# Patient Record
Sex: Male | Born: 1955 | Hispanic: Yes | Marital: Married | Smoking: Former smoker
Health system: Southern US, Community
[De-identification: ages and names within clinical notes are randomized; demographics above are authoritative.]

## PROBLEM LIST (undated history)

## (undated) DIAGNOSIS — I10 Essential (primary) hypertension: Secondary | ICD-10-CM

## (undated) DIAGNOSIS — D571 Sickle-cell disease without crisis: Secondary | ICD-10-CM

## (undated) DIAGNOSIS — N289 Disorder of kidney and ureter, unspecified: Secondary | ICD-10-CM

## (undated) HISTORY — PX: NO PAST SURGERIES: SHX2092

---

## 2014-09-21 ENCOUNTER — Ambulatory Visit (INDEPENDENT_AMBULATORY_CARE_PROVIDER_SITE_OTHER): Payer: BLUE CROSS/BLUE SHIELD | Admitting: Family Medicine

## 2014-09-21 ENCOUNTER — Encounter: Payer: Self-pay | Admitting: Family Medicine

## 2014-09-21 VITALS — BP 164/98 | HR 76 | Ht 65.0 in | Wt 181.6 lb

## 2014-09-21 DIAGNOSIS — I10 Essential (primary) hypertension: Secondary | ICD-10-CM | POA: Diagnosis not present

## 2014-09-21 DIAGNOSIS — E669 Obesity, unspecified: Secondary | ICD-10-CM

## 2014-09-21 DIAGNOSIS — K4090 Unilateral inguinal hernia, without obstruction or gangrene, not specified as recurrent: Secondary | ICD-10-CM | POA: Diagnosis not present

## 2014-09-21 DIAGNOSIS — E66811 Obesity, class 1: Secondary | ICD-10-CM | POA: Insufficient documentation

## 2014-09-21 HISTORY — PX: OTHER SURGICAL HISTORY: SHX169

## 2014-09-21 MED ORDER — LISINOPRIL-HYDROCHLOROTHIAZIDE 10-12.5 MG PO TABS: 1.0000 | ORAL_TABLET | Freq: Every day | ORAL | Status: DC

## 2014-09-21 NOTE — Progress Notes (Signed)
Date:  09/21/2014   Name:  Adele SchilderFaustino Linares   DOB:  [birth year]   MRN:  [id number]  PCP:  Schuyler AmorWilliam Kermit Arnette, MD    Chief Complaint: Inguinal Hernia   History of Present Illness:  This is a 59 y.o. male c/o 6 month hx R lower abdominal pain radiating to R anterior thigh. Seen with Engineer, structuralpanish translator. Thinks may be due to soccer injury two years ago but hit in [place] then. Pain worse with heavy lifting or prolonged walking, works loading trucks. No similar pain in past, no GI/GU sxs, no hx HTN.  Review of Systems:  Review of Systems  Constitutional: Negative for fever and appetite change.  Respiratory: Negative for cough and shortness of breath.   Cardiovascular: Negative for chest pain and leg swelling.  Gastrointestinal: Negative for nausea, vomiting, diarrhea, constipation and abdominal distention.  Genitourinary: Negative for dysuria and difficulty urinating.  Musculoskeletal: Negative for back pain.  Skin: Negative for rash.  Neurological: Negative for syncope.  Psychiatric/Behavioral: Negative for confusion.    There are no active problems to display for this patient.   Prior to Admission medications   Medication Sig Start Date End Date Taking? Authorizing Provider  lisinopril-hydrochlorothiazide (PRINZIDE) 10-12.5 MG per tablet Take 1 tablet by mouth daily. 09/21/14   Schuyler AmorWilliam Yaron Grasse, MD    No Known Allergies  Past Surgical History  Procedure Laterality Date  . None  09/21/2014    History  Substance Use Topics  . Smoking status: Never Smoker   . Smokeless tobacco: Not on file  . Alcohol Use: No    No family history on file.  Medication list has been reviewed and updated.  Physical Examination: BP 164/98 mmHg  Pulse 76  Ht 5\' 5"  (1.651 m)  Wt 181 lb 9.6 oz (82.373 kg)  BMI 30.22 kg/m2  Physical Exam  Constitutional: He is oriented to person, place, and time. He appears well-developed and well-nourished.  HENT:  Head: Normocephalic and atraumatic.  Mouth/Throat:  Oropharynx is clear and moist.  Eyes: Conjunctivae and EOM are normal. Pupils are equal, round, and reactive to light. No scleral icterus.  Neck: No thyromegaly present.  Cardiovascular: Normal rate, regular rhythm and normal heart sounds.   Pulmonary/Chest: Effort normal and breath sounds normal.  Abdominal: Soft. He exhibits no distension and no mass. There is no tenderness.  Genitourinary:  Large R indirect inguinal hernia reducible but bulges with cough, testicles unremarkable  Musculoskeletal: He exhibits no edema.  Lymphadenopathy:    He has no cervical adenopathy.  Neurological: He is alert and oriented to person, place, and time. Coordination normal.  Skin: Skin is warm and dry.  Psychiatric: He has a normal mood and affect. His behavior is normal.    Assessment and Plan:  1. Right inguinal hernia No lifting over 50#, refer for surgical repair (has appt next week per translator) - Ambulatory referral to General Surgery  2. Essential hypertension Begin lisinopril/HCTZ  3. Obesity (BMI 30.0-34.9) Consider blood work next visit  Return in about 4 weeks (around 10/19/2014).  Dionne AnoWilliam M. Kingsley SpittlePlonk, Jr. MD [facility]  09/21/2014

## 2014-09-21 NOTE — Patient Instructions (Signed)
Patient has large right inguinal hernia and has been referred to a Development worker, international aidgeneral surgeon for repair. He should not lift over 50 pounds until the repair is completed but should be able to return to unrestricted work postoperatively.

## 2014-09-26 ENCOUNTER — Telehealth: Payer: Self-pay

## 2014-09-26 NOTE — Telephone Encounter (Signed)
Called patient and he did not answer. I was not able to leave a voicemail since it has not been set-up.

## 2014-09-28 ENCOUNTER — Encounter: Payer: Self-pay | Admitting: Surgery

## 2014-09-28 ENCOUNTER — Ambulatory Visit (INDEPENDENT_AMBULATORY_CARE_PROVIDER_SITE_OTHER): Payer: BLUE CROSS/BLUE SHIELD | Admitting: Surgery

## 2014-09-28 VITALS — BP 161/89 | HR 64 | Temp 97.3°F | Ht 66.0 in | Wt 176.0 lb

## 2014-09-28 DIAGNOSIS — E669 Obesity, unspecified: Secondary | ICD-10-CM

## 2014-09-28 DIAGNOSIS — K4090 Unilateral inguinal hernia, without obstruction or gangrene, not specified as recurrent: Secondary | ICD-10-CM | POA: Diagnosis not present

## 2014-09-28 DIAGNOSIS — I10 Essential (primary) hypertension: Secondary | ICD-10-CM | POA: Diagnosis not present

## 2014-09-28 NOTE — Patient Instructions (Signed)
Usted esta solicitando que se repare su hernia.  Recruitment consultant en [facility].  Planearemos hacerlo la semana del 9 de agosto de 2016.  Nuestra coordinadora de cirujia va a Surveyor, mining todo y lo Best boy.  Si usted tiene alguna pregunta o preocupacion por favor llame a nuestra oficina y Educational psychologist con [place].  Usted necesitara tener una cita antes de la cirujia para prepararlo para la cirujia.  Lo llamaremos para darle la fecha y la hora de la cita.  Cuando usted llegue para la cita, necesitara ir a [employer].  Esto se [place] en [address], [address] (en el segundo piso).  La cita podria durar una a [employer].  Usted necesitara llamar [phone] entre la 1 y las 3 de la tarde el dia antes de su cirujia programada.  No puede comer ni tomar nada despues de la medianoche antes de la cirujia.  Al llegar para su cirujia, vaya al [employer] [facility] y registre en el escritorio que dice Same Day Surgery.

## 2014-09-29 ENCOUNTER — Encounter: Payer: Self-pay | Admitting: Surgery

## 2014-10-01 DIAGNOSIS — K469 Unspecified abdominal hernia without obstruction or gangrene: Secondary | ICD-10-CM | POA: Insufficient documentation

## 2014-10-01 NOTE — Progress Notes (Signed)
Patient ID: Mark Mcmillan, male   DOB: 08-Mar-1956, 59 y.o.   MRN: [id number]  Chief Complaint  Patient presents with  . Inguinal Hernia    Right- x 2 years    HPI  Mark Mcmillan is a 59 y.o. male.  referred with a 2 year history of slowly enlarging right groin hernia.  No prior repairs.  He is interested in repair options.  Seen in office with [facility].  There has been no history of nausea, vomiting or incarceration of the hernia.   Past medical history: Hypertension   Past Surgical History  Procedure Laterality Date  . None  09/21/2014  . No past surgeries      Family History  Problem Relation Age of Onset  . Diabetes Father     Social History History  Substance Use Topics  . Smoking status: Former Smoker    Quit date: 09/27/1993  . Smokeless tobacco: Never Used  . Alcohol Use: No    No Known Allergies  Current Outpatient Prescriptions  Medication Sig Dispense Refill  . lisinopril-hydrochlorothiazide (PRINZIDE) 10-12.5 MG per tablet Take 1 tablet by mouth daily. 30 tablet 2  . lisinopril-hydrochlorothiazide (PRINZIDE,ZESTORETIC) 10-12.5 MG per tablet Take 1 tablet by mouth daily.     No current facility-administered medications for this visit.    Blood pressure 161/89, pulse 64, temperature 97.3 F (36.3 C), temperature source Oral, height  (1.676 m), weight 176 lb (79.833 kg).  Review of Systems  Constitutional: Negative.   HENT: Negative.   Eyes: Negative.   Respiratory: Negative for cough.   Cardiovascular: Negative for chest pain and palpitations.  Gastrointestinal: Negative for heartburn, nausea, vomiting, abdominal pain and diarrhea.       Right groin mass c/w hernia.  Skin: Negative.   Neurological: Negative.   Psychiatric/Behavioral: Negative.   All other systems reviewed and are negative.   Physical Exam  Constitutional: He is oriented to person, place, and time and well-developed, well-nourished, and in no distress. No  distress.  HENT:  Head: Normocephalic and atraumatic.  Eyes: Conjunctivae are normal. Pupils are equal, round, and reactive to light.  Cardiovascular: Normal rate and regular rhythm.   No murmur heard. Pulmonary/Chest: Effort normal and breath sounds normal. No respiratory distress.  Abdominal: Soft. Normal appearance. He exhibits no distension. There is no splenomegaly or hepatomegaly. There is no tenderness. There is no rebound. A hernia is present.    Moderate sized direct right inquinal hernia, reducible, no left inguinal hernia present, no umbilical hernia present.  Genitourinary: Penis normal. He exhibits no abnormal testicular mass and no abnormal scrotal mass.  Neurological: He is alert and oriented to person, place, and time.  Skin: Skin is warm and dry. He is not diaphoretic.  Psychiatric: Memory, affect and judgment normal.   Physical Exam CONSTITUTIONAL:  Pleasant, well-developed, well-nourished, and in no acute distress. EYES: Pupils equal and reactive to light, Sclera non-icteric EARS, NOSE, MOUTH AND THROAT:  The oropharynx was clear.  Dentition is good repair.  Oral mucosa pink and moist. LYMPH NODES:  Lymph nodes in the neck and axillae were normal RESPIRATORY:  Lungs were clear.  Normal respiratory effort without pathologic use of accessory muscles of respiration CARDIOVASCULAR: Heart was regular without murmurs.  There were no carotid bruits. GI: The abdomen was soft, nontender, and nondistended. There were no palpable masses. There was no hepatosplenomegaly. There were normal bowel sounds in all quadrants. GU:  Rectal deferred.   MUSCULOSKELETAL:  Normal muscle strength  and tone.  No clubbing or cyanosis.   SKIN:  There were no pathologic skin lesions.  There were no nodules on palpation. NEUROLOGIC:  Sensation is normal.  Cranial nerves are grossly intact. PSYCH:  Oriented to person, place and time.  Mood and affect are normal.   Assessment    Symptomatic right  inguinal hernia, direct  Hypertension, essential.    Plan    I discussed with him through the [facility] a laparoscopic approach to hernia repair.  We discussed risk of infection, recurrence, infection and chronic pain.  All questions addressed and he wishes to proceed       Natale Lay MD, FACS 10/01/2014, 8:19 PM

## 2014-10-04 ENCOUNTER — Telehealth: Payer: Self-pay

## 2014-10-04 NOTE — Telephone Encounter (Signed)
I have been calling patient several times and have not been able to leave a voicemail on his cell phone since it is not set-up yet.  I will send patient a letter letting him know about his appointment with Admitting and his surgery date with Dr. Egbert Garibaldi in Spanish (see letter).

## 2014-10-09 ENCOUNTER — Encounter
Admission: RE | Admit: 2014-10-09 | Discharge: 2014-10-09 | Disposition: A | Discharge status: Home / Self Care | Payer: BLUE CROSS/BLUE SHIELD | Source: Ambulatory Visit | Attending: Surgery | Admitting: Surgery

## 2014-10-09 ENCOUNTER — Other Ambulatory Visit: Payer: Self-pay

## 2014-10-09 DIAGNOSIS — Z01810 Encounter for preprocedural cardiovascular examination: Secondary | ICD-10-CM | POA: Insufficient documentation

## 2014-10-09 DIAGNOSIS — Z01812 Encounter for preprocedural laboratory examination: Secondary | ICD-10-CM | POA: Diagnosis present

## 2014-10-09 HISTORY — DX: Essential (primary) hypertension: I10

## 2014-10-09 LAB — CBC WITH DIFFERENTIAL/PLATELET
Basophils Absolute: 0 10*3/uL (ref 0–0.1)
Basophils Relative: 0 %
EOS PCT: 2 %
Eosinophils Absolute: 0.1 10*3/uL (ref 0–0.7)
HCT: 44.3 % (ref 40.0–52.0)
HEMOGLOBIN: 14.6 g/dL (ref 13.0–18.0)
Lymphocytes Relative: 24 %
Lymphs Abs: 1.5 10*3/uL (ref 1.0–3.6)
MCH: 27.9 pg (ref 26.0–34.0)
MCHC: 33 g/dL (ref 32.0–36.0)
MCV: 84.5 fL (ref 80.0–100.0)
Monocytes Absolute: 0.5 10*3/uL (ref 0.2–1.0)
Monocytes Relative: 9 %
Neutro Abs: 4 10*3/uL (ref 1.4–6.5)
Neutrophils Relative %: 65 %
Platelets: 234 10*3/uL (ref 150–440)
RBC: 5.24 MIL/uL (ref 4.40–5.90)
RDW: 13.5 % (ref 11.5–14.5)
WBC: 6.1 10*3/uL (ref 3.8–10.6)

## 2014-10-09 LAB — BASIC METABOLIC PANEL
Anion gap: 8 (ref 5–15)
BUN: 13 mg/dL (ref 6–20)
CO2: 26 mmol/L (ref 22–32)
CREATININE: 0.67 mg/dL (ref 0.61–1.24)
Calcium: 9.2 mg/dL (ref 8.9–10.3)
Chloride: 102 mmol/L (ref 101–111)
GFR calc Af Amer: >60 mL/min (ref >60)
GFR calc non Af Amer: >60 mL/min (ref >60)
Glucose, Bld: 112 mg/dL — ABNORMAL HIGH (ref 65–99)
POTASSIUM: 3.7 mmol/L (ref 3.5–5.1)
SODIUM: 136 mmol/L (ref 135–145)

## 2014-10-09 NOTE — Pre-Procedure Instructions (Signed)
Received phone call from Dr. Pernell Dupre regarding Pt's EKG done today, results may be over read by machine.  If Read EKG done by cardiologist comes back endocarditis, we will need a medical clearance.  Read EKG, no ECG available  for comparison.  Medical clearance called and faxed to Angie at [facility].

## 2014-10-09 NOTE — Pre-Procedure Instructions (Signed)
EKG to anesthesia for review. 

## 2014-10-09 NOTE — Patient Instructions (Signed)
  Your procedure is scheduled on: October 19, 2014 Report to Day Surgery. To find out your arrival time please call [phone] between 1PM - 3PM on October 18, 2014.  Remember: Instructions that are not followed completely may result in serious medical risk, up to and including death, or upon the discretion of your surgeon and anesthesiologist your surgery may need to be rescheduled.    __x__ 1. Do not eat food or drink liquids after midnight. No gum chewing or hard candies.     ____ 2. No Alcohol for 24 hours before or after surgery.   ____ 3. Bring all medications with you on the day of surgery if instructed.    __x__ 4. Notify your doctor if there is any change in your medical condition     (cold, fever, infections).     Do not wear jewelry, make-up, hairpins, clips or nail polish.  Do not wear lotions, powders, or perfumes. You may wear deodorant.  Do not shave 48 hours prior to surgery. Men may shave face and neck.  Do not bring valuables to the hospital.    [facility] is not responsible for any belongings or valuables.               Contacts, dentures or bridgework may not be worn into surgery.  Leave your suitcase in the car. After surgery it may be brought to your room.  For patients admitted to the hospital, discharge time is determined by your                treatment team.   Patients discharged the day of surgery will not be allowed to drive home.   Please read over the following fact sheets that you were given:   Surgical Site Infection Prevention   ____ Take these medicines the morning of surgery with A SIP OF WATER:    1.   2.   3.   4.  5.  6.  ____ Fleet Enema (as directed)   _x___ Use CHG Soap as directed  ____ Use inhalers on the day of surgery  ____ Stop metformin 2 days prior to surgery    ____ Take 1/2 of usual insulin dose the night before surgery and none on the morning of surgery.   __x__ Stop Coumadin/Plavix/aspirin on now ____ Stop  Anti-inflammatories on    ____ Stop supplements until after surgery.    ____ Bring C-Pap to the hospital.

## 2014-10-10 ENCOUNTER — Telehealth: Payer: Self-pay

## 2014-10-10 NOTE — Telephone Encounter (Signed)
Patient called me back. I gave him his appointment to the cardiologist. Patient asked for me to call [facility] and make sure that they had an interpreter for him at his appointment. I told him that I would. Right after I hung up with patient, I called [facility] and asked if they could request an interpreter. They stated that they had.

## 2014-10-10 NOTE — Telephone Encounter (Signed)
Called patient at work [phone]). I told him that the reason I was contacting him was because when he had his pre-op appointment, he had an EKG. I told him that it showed him having possible acute pericarditis and due to this, he needed to see a cardiologist before surgery. Patient understood but he stated that he would be contacting me after his shift was over.  His appointment will be at [facility] [place], [address], [address], [place], [place] [address] [facility] with Dr. Kristeen Miss.

## 2014-10-13 ENCOUNTER — Ambulatory Visit (INDEPENDENT_AMBULATORY_CARE_PROVIDER_SITE_OTHER): Payer: BLUE CROSS/BLUE SHIELD | Admitting: Cardiovascular Disease

## 2014-10-13 ENCOUNTER — Encounter: Payer: Self-pay | Admitting: Cardiovascular Disease

## 2014-10-13 VITALS — BP 130/66 | HR 62 | Ht 66.0 in | Wt 180.5 lb

## 2014-10-13 DIAGNOSIS — I10 Essential (primary) hypertension: Secondary | ICD-10-CM

## 2014-10-13 NOTE — Progress Notes (Signed)
Cardiology Office Note   Date:  10/13/2014   ID:  Mark, Mcmillan 1955-06-12, MRN [id number]  PCP:  No PCP Per Patient  Cardiologist:   Elease Hashimoto Deloris Ping, MD   Chief Complaint  Patient presents with  . other    Ref by Dr. Michela Pitcher for hernia surgery which is next Wednesday. Meds reviewed by the patient verbally. Denies chest pain or shortness of breath.       History of Present Illness: Mark Mcmillan is a 59 y.o. male who presents for pre -op evaluation prior to hernia surgery.   He does not speak [place].  Was seen by intrepreter [name])   The patient does not have any medical history. His blood pressure was elevated when he went to the surgeon's office.  He was very nervous when he first went to the surgeons office.   They re-took the BP at the end of the visit and it was nromal.    He was started on lisinopril had to thiazide but felt poorly on the medication. He stopped the medication after several days.  He returns today for preoperative evaluation prior to having surgery. His blood pressure is normal despite the fact that he is not on any blood pressure medications. He has no medical problems. 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 Hypertension     Past Surgical History  Procedure Laterality Date  . None  09/21/2014  . No past surgeries       No current outpatient prescriptions on file.   No current facility-administered medications for this visit.    Allergies:   Review of patient's allergies indicates no known allergies.    Social History:  The patient  reports that he quit smoking about 21 years ago. His smoking use included Cigarettes. He smoked 0.25 packs per day. He has never used smokeless tobacco. He reports that he does not drink alcohol or use illicit drugs.   Family History:   The patient's family history includes Diabetes in his father.    ROS:  Please see the history of present illness.    Review of Systems: Constitutional:  denies fever, chills, diaphoresis, appetite change and fatigue.  HEENT: denies photophobia, eye pain, redness, hearing loss, ear pain, congestion, sore throat, rhinorrhea, sneezing, neck pain, neck stiffness and tinnitus.  Respiratory: denies SOB, DOE, cough, chest tightness, and wheezing.  Cardiovascular: denies chest pain, palpitations and leg swelling.  Gastrointestinal: denies nausea, vomiting, abdominal pain, diarrhea, constipation, blood in stool.  Genitourinary: denies dysuria, urgency, frequency, hematuria, flank pain and difficulty urinating.  Musculoskeletal: denies  myalgias, back pain, joint swelling, arthralgias and gait problem.   Skin: denies pallor, rash and wound.  Neurological: denies dizziness, seizures, syncope, weakness, light-headedness, numbness and headaches.   Hematological: denies adenopathy, easy bruising, personal or family bleeding history.  Psychiatric/ Behavioral: denies suicidal ideation, mood changes, confusion, nervousness, sleep disturbance and agitation.       All other systems are reviewed and negative.    PHYSICAL EXAM: VS:  BP 130/66 mmHg  Pulse 62  Ht 5\' 6"  (1.676 m)  Wt 81.874 kg (180 lb 8 oz)  BMI 29.15 kg/m2 , BMI Body mass index is 29.15 kg/(m^2). GEN: Well nourished, well developed, in no acute distress HEENT: normal Neck:  no JVD, carotid bruits, or masses Cardiac: RRR; no murmurs, rubs, or gallops,no edema  Respiratory:  clear to auscultation bilaterally, normal work of breathing GI: soft, nontender, nondistended, + BS MS: no deformity or atrophy Skin: warm and dry, no rash Neuro:  Strength and sensation are intact Psych: normal   EKG:  EKG is ordered today. The ekg ordered today demonstrates NSR at 62.  Normal ECG    Recent Labs: 10/09/2014: BUN 13; Creatinine, Ser 0.67;  Hemoglobin 14.6; Platelets 234; Potassium 3.7; Sodium 136    Lipid Panel No results found for: CHOL, TRIG, HDL, CHOLHDL, VLDL, LDLCALC, LDLDIRECT    Wt Readings from Last 3 Encounters:  10/13/14 81.874 kg (180 lb 8 oz)  10/09/14 80 kg (176 lb 5.9 oz)  09/28/14 79.833 kg (176 lb)      Other studies Reviewed: Additional studies/ records that were reviewed today include: . Review of the above records demonstrates:    ASSESSMENT AND PLAN:  1. Transient HTN:  The patients BP was transiently elevated when he was at the surgeons office.  It resolved very quickly . Was started on Lisinopril / HCTZ but could not tolerate it. BP is normal here today on no meds.   I think that his transient HTN was just due to anxiety and doubt he needs any BP meds at this point.  2. Preoperative visit prior to hernia surgery: Patient works very hard, physical job at his workplace. He's never had any episodes of chest pain or shortness breath. It sounds like he can easily fairly high work loads without angina or dyspnea.  He does not need any further testing .  He is at low risk for CV complication during hernia surgery .    Current medicines are reviewed at length with the patient today.  The patient does not have concerns regarding medicines.  The following changes have been made:  no change  Labs/ tests ordered today include:  Orders Placed This Encounter  Procedures  . EKG 12-Lead     Disposition:   FU with me as needed.       Siniyah Evangelist, Deloris Ping, MD  10/13/2014 3:36 PM    [facility] [address] [place], [place], [place]  [address] Phone: [phone]; Fax: [phone]   [facility]  [address] [place], [place]  [address] [phone]   Fax [phone]

## 2014-10-13 NOTE — Patient Instructions (Signed)
Medication Instructions:  Your physician recommends that you continue on your current medications as directed. Please refer to the Current Medication list given to you today.   Labwork: none  Testing/Procedures: none  Follow-Up: Your physician recommends that you schedule a follow-up appointment as needed with Dr. Nahser.    Any Other Special Instructions Will Be Listed Below (If Applicable).   

## 2014-10-16 NOTE — OR Nursing (Signed)
CLEARED LOW RISK BY DR Melburn Popper. 10/13/14

## 2014-10-18 ENCOUNTER — Telehealth: Payer: Self-pay

## 2014-10-18 NOTE — Telephone Encounter (Signed)
Donald Pore (patient's friend) called and explained that patient wants to cancel surgery for tomorrow due to insurance problem. I explained that he would be billed after his insurance has paid and that he does not have to pay anything tomorrow. He says that patient does not have enough money saved up to pay for the deductible and wants to cancel surgery until he can get full amount of deductible saved up.  Tacey Ruiz was notified at this time.   Dr. Egbert Garibaldi and Dr. Excell Seltzer (assist) has been notified.

## 2014-10-19 ENCOUNTER — Ambulatory Visit: Admission: RE | Admit: 2014-10-19 | Payer: BLUE CROSS/BLUE SHIELD | Source: Ambulatory Visit | Admitting: Surgery

## 2014-10-19 ENCOUNTER — Encounter: Admission: RE | Payer: Self-pay | Source: Ambulatory Visit

## 2014-10-19 SURGERY — REPAIR, HERNIA, INGUINAL, LAPAROSCOPIC
Anesthesia: Choice | Laterality: Right

## 2016-01-15 ENCOUNTER — Other Ambulatory Visit: Payer: Self-pay | Admitting: Family Medicine

## 2016-01-15 DIAGNOSIS — M25551 Pain in right hip: Secondary | ICD-10-CM

## 2016-01-15 DIAGNOSIS — R945 Abnormal results of liver function studies: Secondary | ICD-10-CM

## 2016-01-15 DIAGNOSIS — M25552 Pain in left hip: Secondary | ICD-10-CM

## 2016-01-15 DIAGNOSIS — R630 Anorexia: Secondary | ICD-10-CM

## 2016-01-15 DIAGNOSIS — R748 Abnormal levels of other serum enzymes: Secondary | ICD-10-CM

## 2016-01-25 ENCOUNTER — Ambulatory Visit
Admission: RE | Admit: 2016-01-25 | Discharge: 2016-01-25 | Disposition: A | Discharge status: Home / Self Care | Payer: BLUE CROSS/BLUE SHIELD | Source: Ambulatory Visit | Attending: Family Medicine | Admitting: Family Medicine

## 2016-01-25 DIAGNOSIS — K4090 Unilateral inguinal hernia, without obstruction or gangrene, not specified as recurrent: Secondary | ICD-10-CM | POA: Diagnosis not present

## 2016-01-25 DIAGNOSIS — M25552 Pain in left hip: Secondary | ICD-10-CM

## 2016-01-25 DIAGNOSIS — I700 Atherosclerosis of aorta: Secondary | ICD-10-CM | POA: Diagnosis not present

## 2016-01-25 DIAGNOSIS — M25551 Pain in right hip: Secondary | ICD-10-CM

## 2016-01-25 DIAGNOSIS — K5900 Constipation, unspecified: Secondary | ICD-10-CM | POA: Insufficient documentation

## 2016-01-25 DIAGNOSIS — N400 Enlarged prostate without lower urinary tract symptoms: Secondary | ICD-10-CM | POA: Diagnosis not present

## 2016-01-25 DIAGNOSIS — R748 Abnormal levels of other serum enzymes: Secondary | ICD-10-CM

## 2016-01-25 DIAGNOSIS — R911 Solitary pulmonary nodule: Secondary | ICD-10-CM | POA: Insufficient documentation

## 2016-01-25 DIAGNOSIS — R945 Abnormal results of liver function studies: Secondary | ICD-10-CM | POA: Diagnosis not present

## 2016-01-25 DIAGNOSIS — R630 Anorexia: Secondary | ICD-10-CM

## 2016-01-25 HISTORY — DX: Disorder of kidney and ureter, unspecified: N28.9

## 2016-01-25 HISTORY — DX: Sickle-cell disease without crisis: D57.1

## 2016-01-25 LAB — POCT I-STAT CREATININE: Creatinine, Ser: 0.6 mg/dL — ABNORMAL LOW (ref 0.61–1.24)

## 2016-01-25 MED ORDER — IOPAMIDOL (ISOVUE-300) INJECTION 61%
100.0000 mL | Freq: Once | INTRAVENOUS | Status: AC | PRN
  Administered 2016-01-25: 100 mL via INTRAVENOUS

## 2016-01-29 ENCOUNTER — Other Ambulatory Visit: Payer: Self-pay | Admitting: Family Medicine

## 2016-01-29 DIAGNOSIS — R972 Elevated prostate specific antigen [PSA]: Secondary | ICD-10-CM

## 2016-01-29 DIAGNOSIS — R945 Abnormal results of liver function studies: Secondary | ICD-10-CM

## 2016-01-29 DIAGNOSIS — R748 Abnormal levels of other serum enzymes: Secondary | ICD-10-CM

## 2016-01-29 DIAGNOSIS — R918 Other nonspecific abnormal finding of lung field: Secondary | ICD-10-CM

## 2016-02-08 ENCOUNTER — Encounter
Admission: RE | Admit: 2016-02-08 | Discharge: 2016-02-08 | Disposition: A | Discharge status: Home / Self Care | Payer: BLUE CROSS/BLUE SHIELD | Source: Ambulatory Visit | Attending: Family Medicine | Admitting: Family Medicine

## 2016-02-08 DIAGNOSIS — R918 Other nonspecific abnormal finding of lung field: Secondary | ICD-10-CM | POA: Insufficient documentation

## 2016-02-08 DIAGNOSIS — R972 Elevated prostate specific antigen [PSA]: Secondary | ICD-10-CM | POA: Diagnosis present

## 2016-02-08 DIAGNOSIS — R748 Abnormal levels of other serum enzymes: Secondary | ICD-10-CM | POA: Diagnosis present

## 2016-02-08 DIAGNOSIS — R945 Abnormal results of liver function studies: Secondary | ICD-10-CM | POA: Diagnosis present

## 2016-02-08 MED ORDER — TECHNETIUM TC 99M MEDRONATE IV KIT
25.0000 | PACK | Freq: Once | INTRAVENOUS | Status: AC | PRN
  Administered 2016-02-08: 23.66 via INTRAVENOUS

## 2018-11-16 IMAGING — CT CT ABD-PELV W/ CM
2 of 5 series · 15 of 46 positions shown, 17 images · IV contrast (APPLIED)
Comparison: None.

CLINICAL DATA: Pt no operation on abdomen or pelvisPt no hx of
cancerPt pt states he has lower back pain and hip pain on both
sides.Pt states pain has been on going for past month.Pt states he
medical exam showed he had large prostate.Pt has lost 10 pounds
within a month.PT has been experiencing constipation for past month
as well.

EXAM:
CT ABDOMEN AND PELVIS WITH CONTRAST
TECHNIQUE: Multidetector CT imaging of the abdomen and pelvis was performed
using the standard protocol following bolus administration of
intravenous contrast.
CONTRAST:  100mL WIX79X-CTT IOPAMIDOL (WIX79X-CTT) INJECTION 61%

[Series 2: axial st · axial · 0.74mm/px · z∈[-968,-498]mm · 12 of 106 slices shown, 14 images]
[im 6/106  soft-tissue]
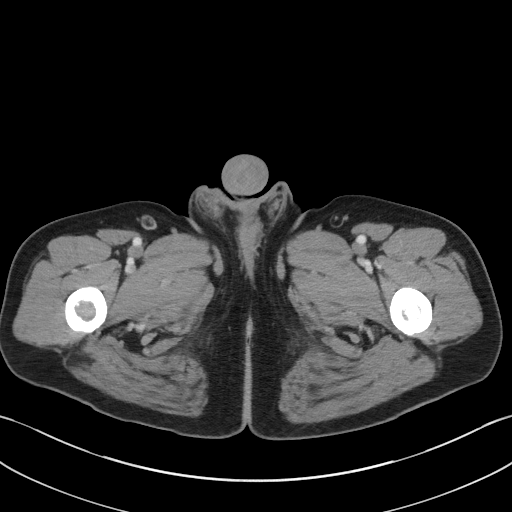
[im 6/106  bone]
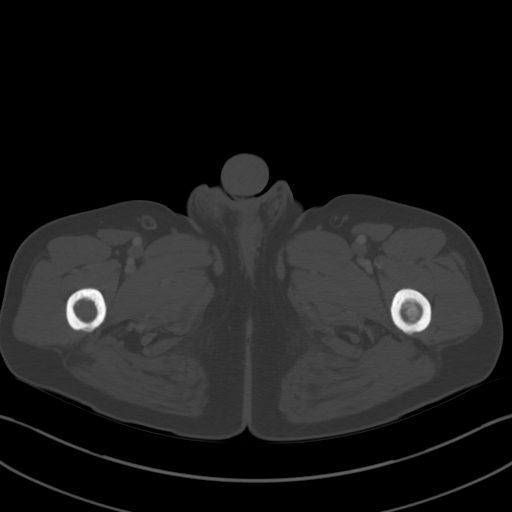
[im 17/106  soft-tissue]
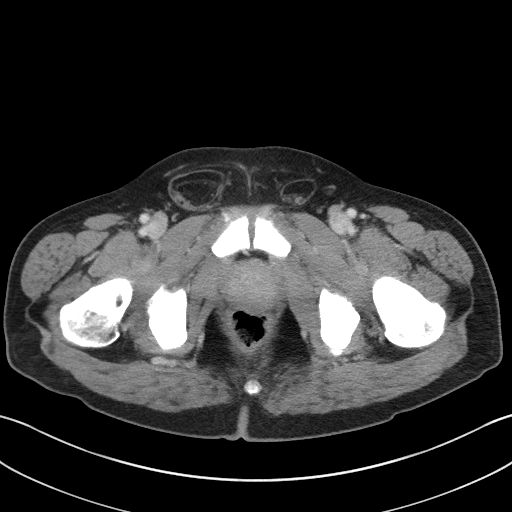
[im 23/106  soft-tissue]
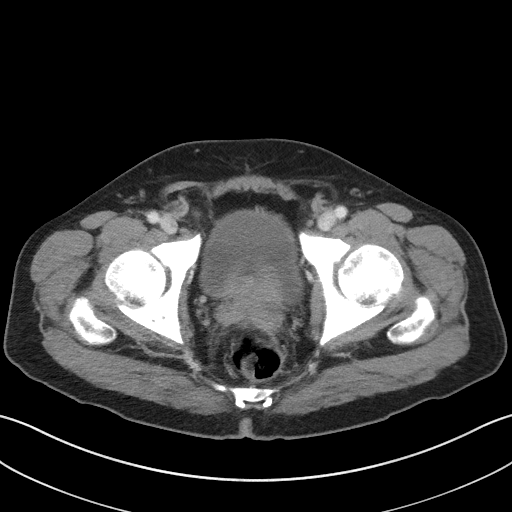
[im 34/106  soft-tissue]
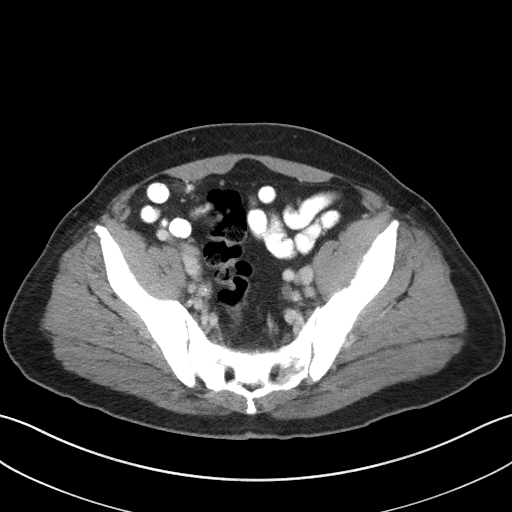
[im 39/106  soft-tissue]
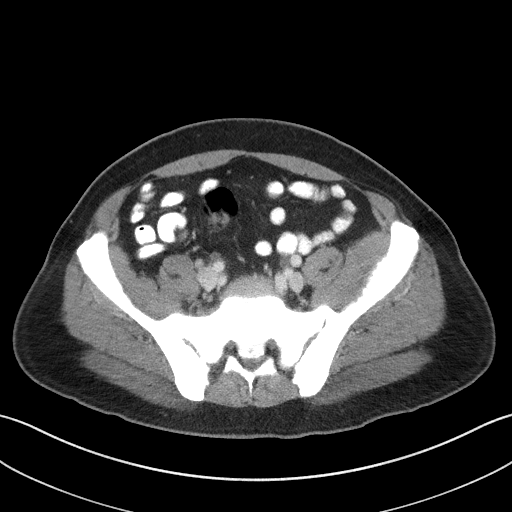
[im 50/106  soft-tissue]
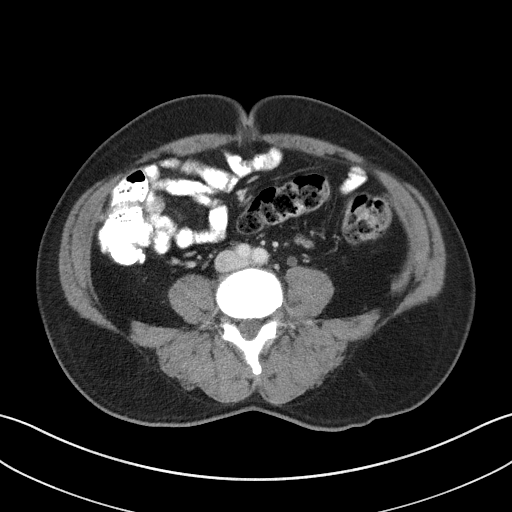
[im 56/106  soft-tissue]
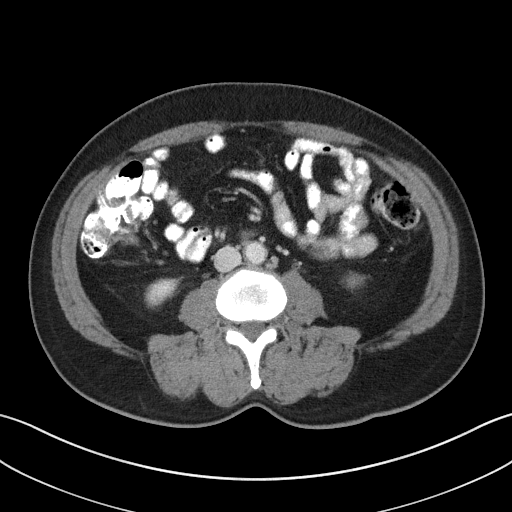
[im 67/106  soft-tissue]
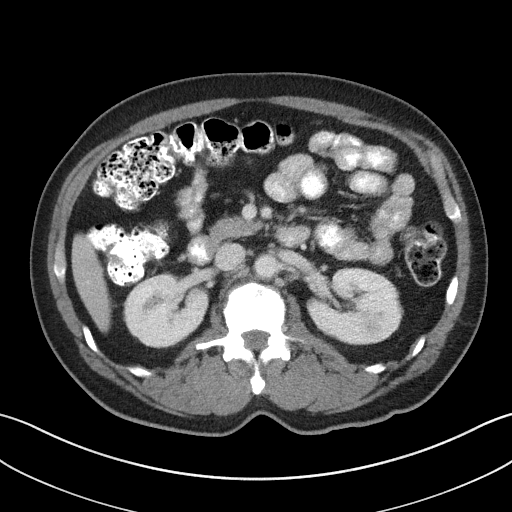
[im 72/106  soft-tissue]
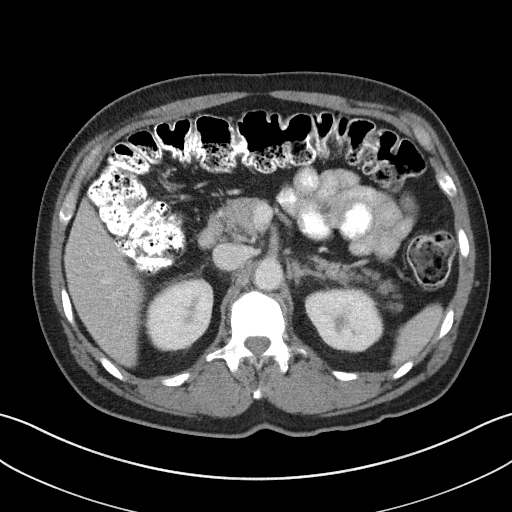
[im 72/106  bone]
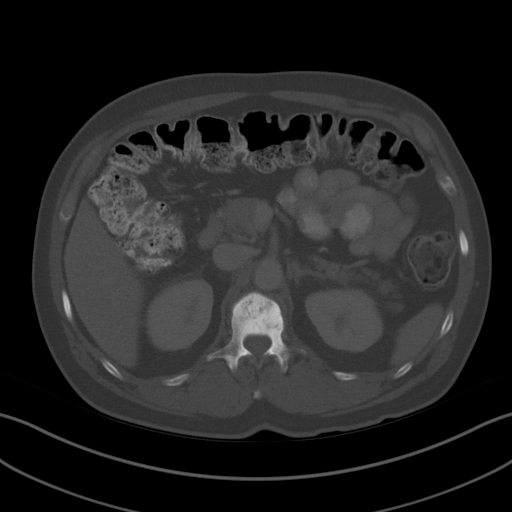
[im 83/106  soft-tissue]
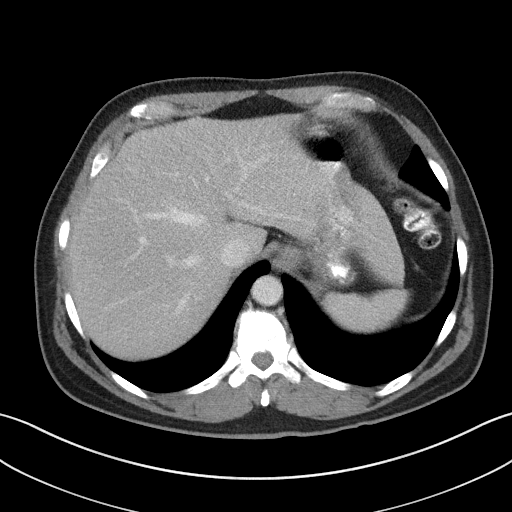
[im 89/106  soft-tissue]
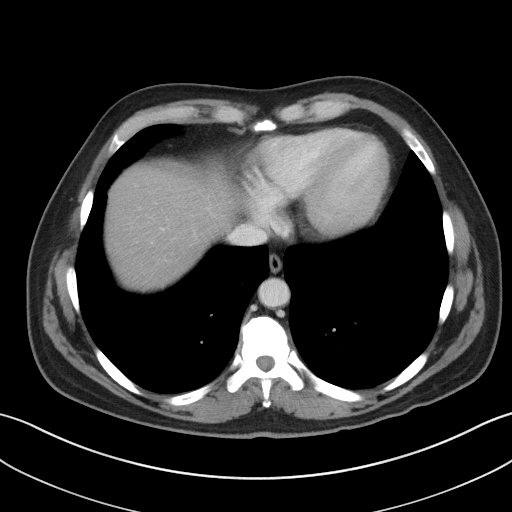
[im 100/106  soft-tissue]
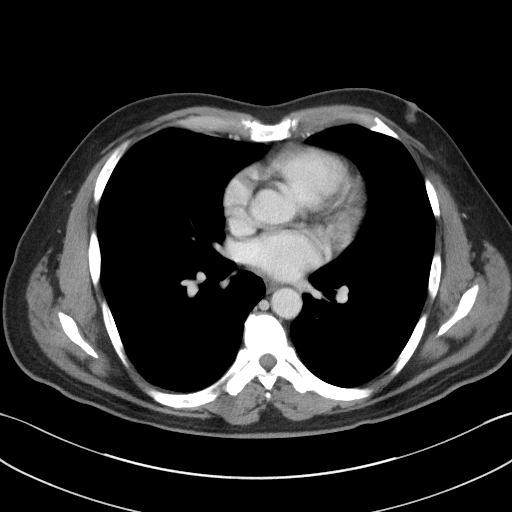

[Series 6: coronal st · coronal · 0.71mm/px · 3 of 88 slices shown]
[im 30/88  soft-tissue]
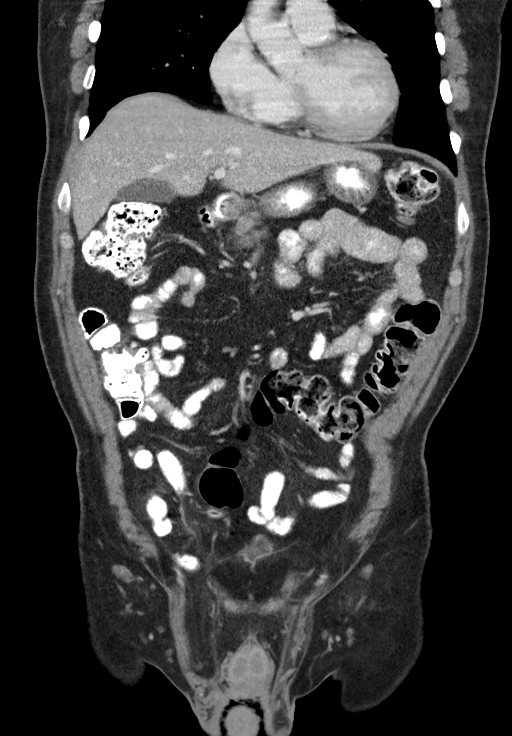
[im 39/88  soft-tissue]
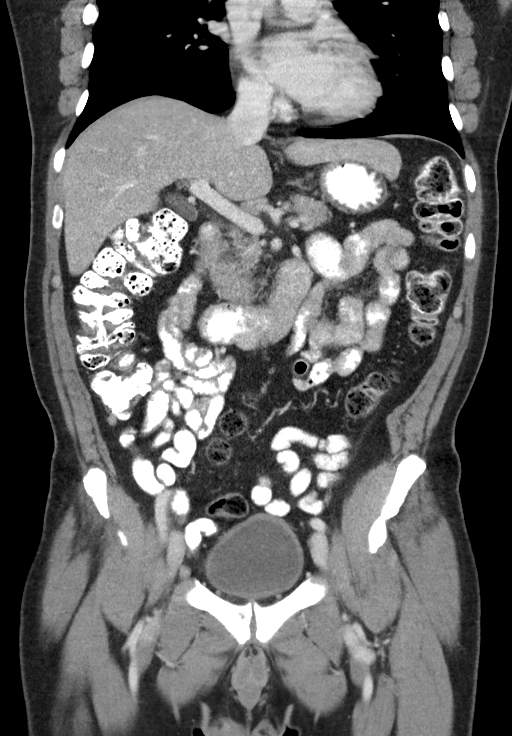
[im 49/88  soft-tissue]
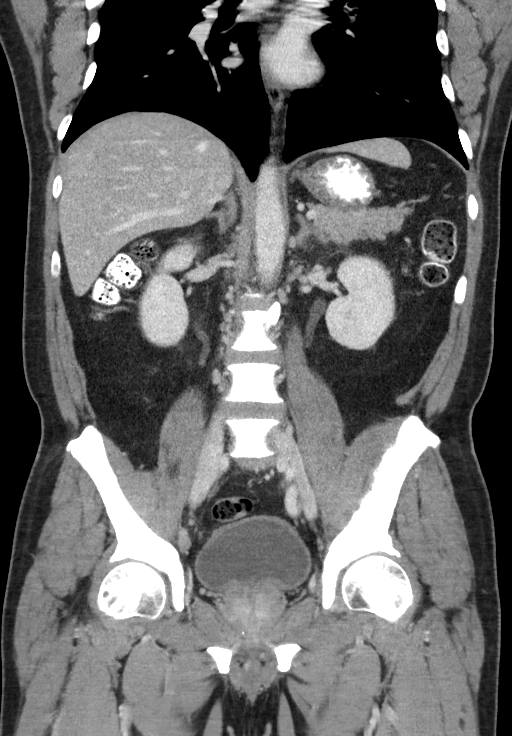

[15 of 46 positions shown; findings below may reference images not displayed]

FINDINGS: Lower chest: 3 discrete lung nodules adjacent to the minor fissure,
with another small nodule, less well-defined, suggested in the left
upper lobe lingula. Largest nodule measures 4.7 x 3.6 mm, average
approximate 4.2 mm. No acute findings at the lung bases. Heart
normal size.

Hepatobiliary: No focal liver abnormality is seen. No gallstones,
gallbladder wall thickening, or biliary dilatation.

Pancreas: Unremarkable. No pancreatic ductal dilatation or
surrounding inflammatory changes.

Spleen: Normal in size without focal abnormality.

Adrenals/Urinary Tract: Adrenal glands are unremarkable. Kidneys are
normal, without renal calculi, focal lesion, or hydronephrosis.
Bladder is unremarkable.

Stomach/Bowel: Stomach is within normal limits. Appendix appears
normal. No evidence of bowel wall thickening, distention, or
inflammatory changes.

Vascular/Lymphatic: Aortic atherosclerosis. No enlarged abdominal or
pelvic lymph nodes.

Reproductive: Prostate is enlarged and heterogeneous measuring
cm x 4.3 cm x 4.8 cm, bulging against the bladder base.

Other: Fat containing inguinal hernias. No bowel enters these. No
other hernia. No ascites.

Musculoskeletal: There is extensive bony sclerosis, throughout most
of the visualized spine and essentially occupying the entire pelvis.
This also extends into the proximal femurs and visualized ribs. Note
is also made of bilateral chronic pars defects at L5-S1 with a grade
1 anterolisthesis.
IMPRESSION: 1. No acute findings within the abdomen or pelvis.
2. Prostate carcinoma with skeletal metastatic disease is strongly
suspected. Prostate is enlarged and heterogeneous. There are
widespread sclerotic lesions throughout the visualized spine
consistent with blastic metastatic disease. No retroperitoneal
adenopathy. Small lung base nodules are noted, largest measuring an
average of 4.2 mm. These are most likely benign despite the presence
of skeletal metastatic disease.
